# Patient Record
Sex: Male | Born: 1980
Health system: Southern US, Community
[De-identification: ages and names within clinical notes are randomized; demographics above are authoritative.]

---

## 2006-01-21 ENCOUNTER — Emergency Department (HOSPITAL_COMMUNITY): Admission: EM | Admit: 2006-01-21 | Discharge: 2006-01-21 | Payer: Self-pay | Admitting: Emergency Medicine

## 2008-01-16 IMAGING — CR DG ANKLE COMPLETE 3+V*L*
3 series · 3 of 3 positions shown · non-contrast
Comparison: none

CLINICAL DATA: Dog bite.
 RIGHT THIRD FINGER:

[view not recorded (1 of 3)]
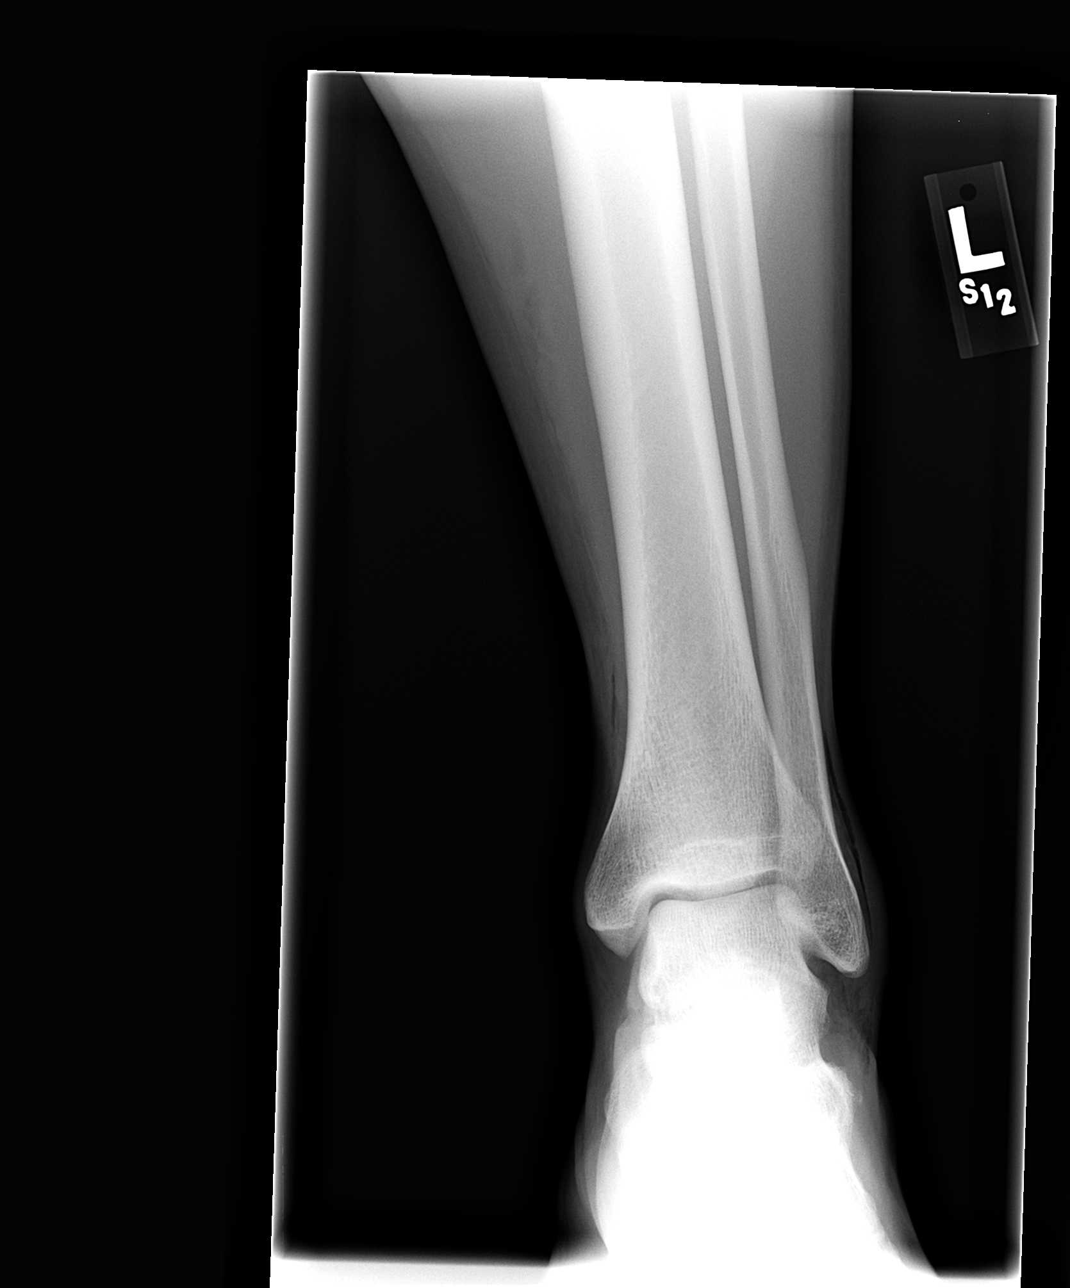

[view not recorded (2 of 3)]
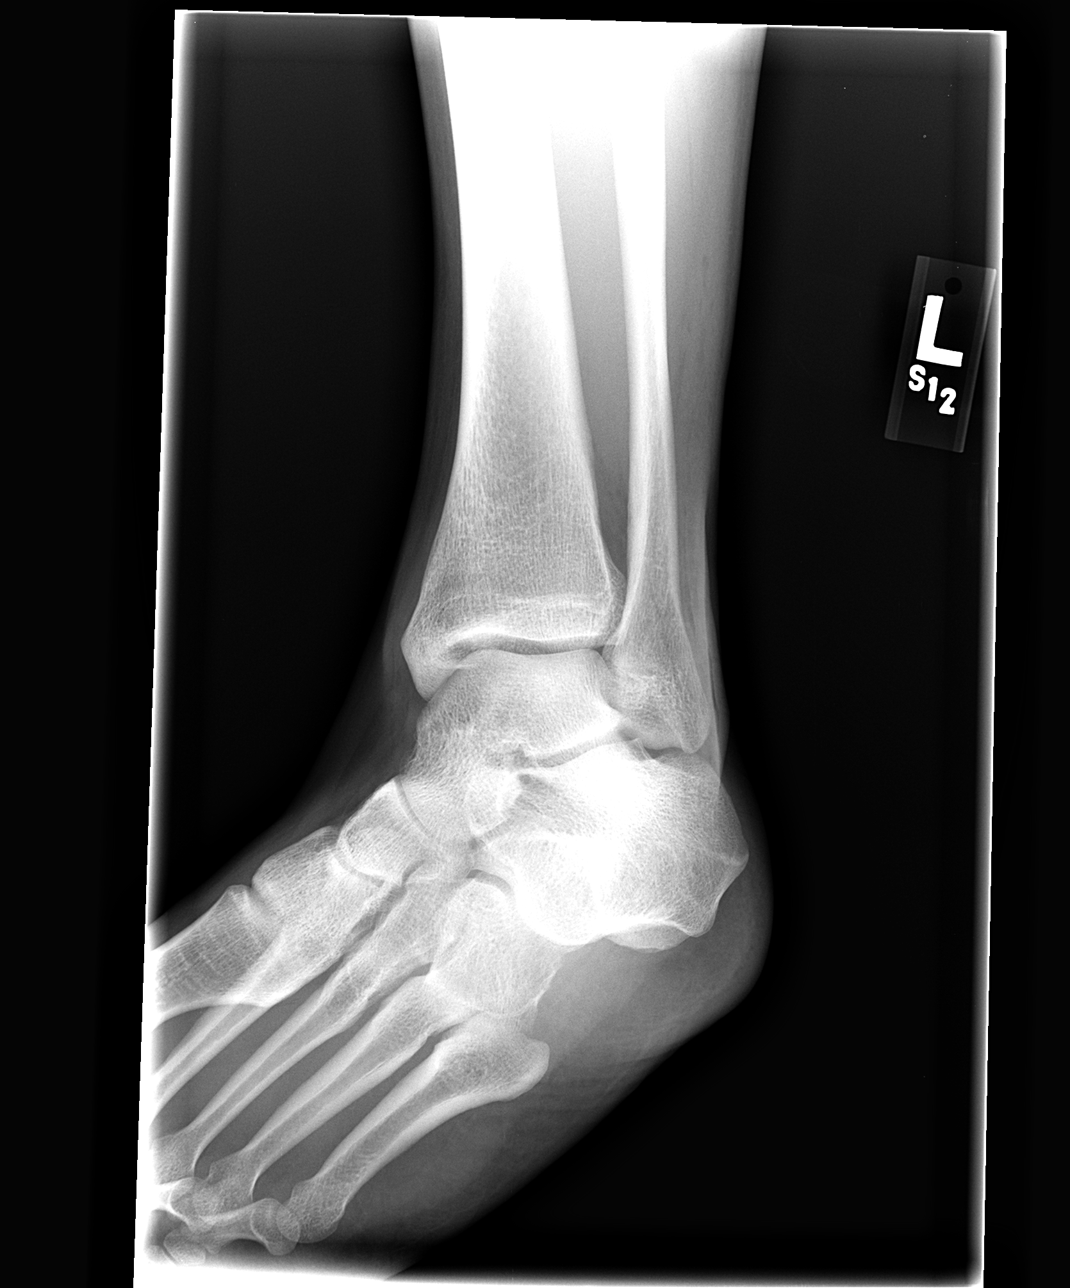

[view not recorded (3 of 3)]
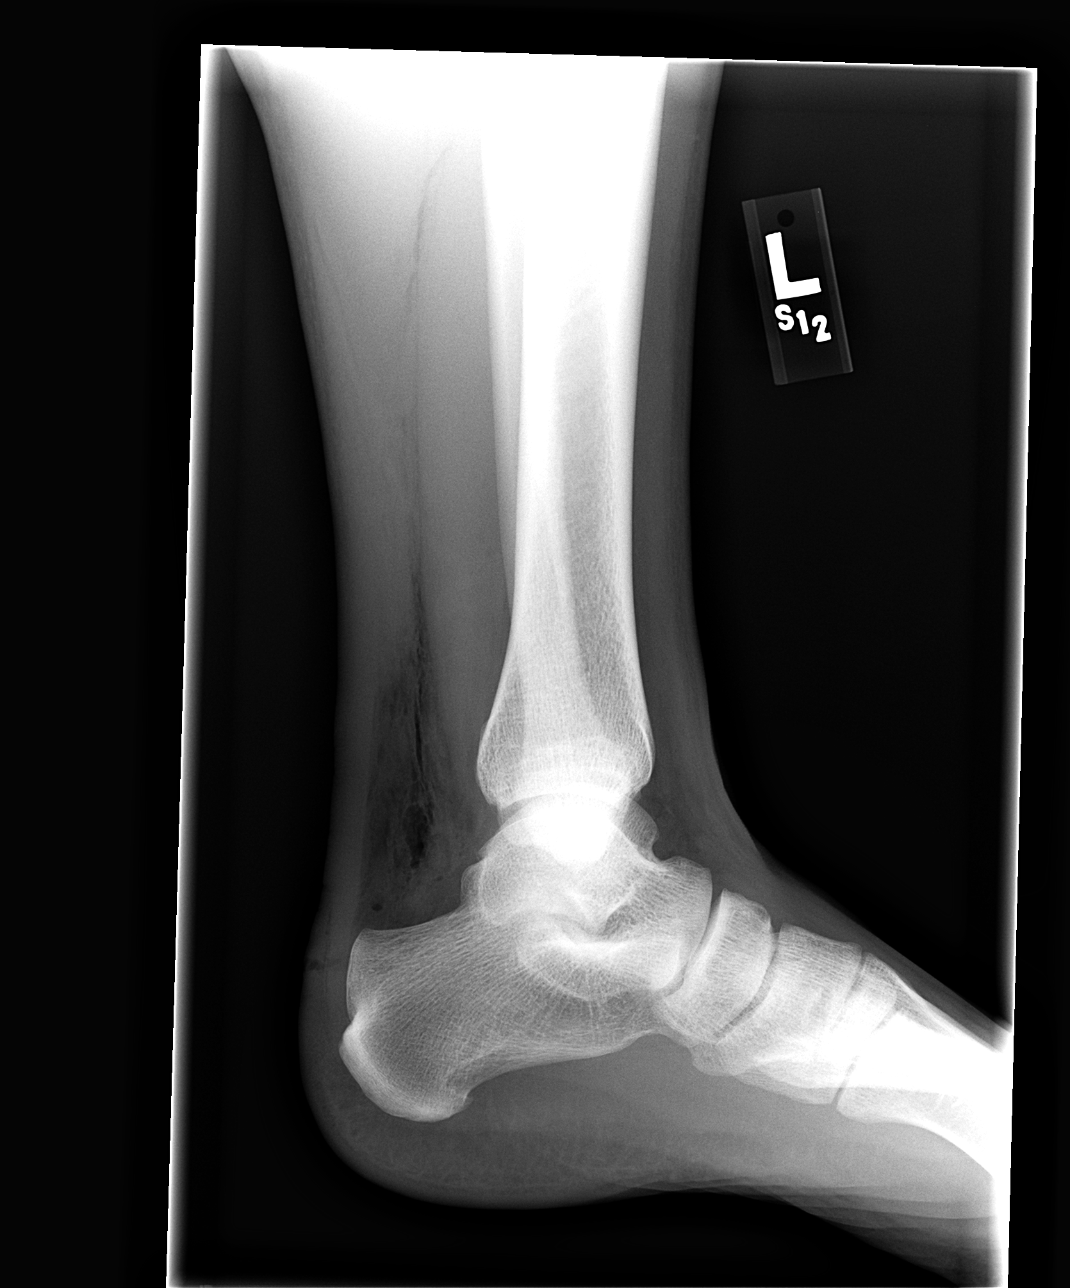

[3 of 3 positions shown; findings below may reference images not displayed]

FINDINGS: There is a fracture of the tuft of the distal phalanx with a 1 mm bone fragment in the soft tissues.  There is also laceration present.
IMPRESSION: Small avulsion fracture of the tuft of the distal third phalanx.
 LEFT ANKLE ? 3 VIEW:
FINDINGS: Normal alignment.  No fracture.  No radiopaque foreign body seen.  There is laceration with gas in the deep soft tissues posterior to the ankle.
IMPRESSION: Negative for fracture.

## 2019-12-30 ENCOUNTER — Ambulatory Visit
Admission: RE | Admit: 2019-12-30 | Discharge: 2019-12-30 | Disposition: A | Discharge status: Home / Self Care | Payer: Commercial Managed Care - PPO | Source: Ambulatory Visit | Attending: Family Medicine | Admitting: Family Medicine

## 2019-12-30 ENCOUNTER — Other Ambulatory Visit: Payer: Self-pay | Admitting: Family Medicine

## 2019-12-30 DIAGNOSIS — R0789 Other chest pain: Secondary | ICD-10-CM

## 2020-02-27 ENCOUNTER — Other Ambulatory Visit: Payer: Self-pay | Admitting: Family Medicine

## 2020-02-27 DIAGNOSIS — R7989 Other specified abnormal findings of blood chemistry: Secondary | ICD-10-CM

## 2020-03-09 ENCOUNTER — Other Ambulatory Visit: Payer: Commercial Managed Care - PPO

## 2021-01-08 ENCOUNTER — Other Ambulatory Visit: Payer: Self-pay | Admitting: Family Medicine

## 2021-01-08 DIAGNOSIS — R7989 Other specified abnormal findings of blood chemistry: Secondary | ICD-10-CM

## 2021-02-15 ENCOUNTER — Other Ambulatory Visit: Payer: Self-pay

## 2021-02-15 ENCOUNTER — Ambulatory Visit: Payer: Commercial Managed Care - PPO | Admitting: Podiatry

## 2021-02-15 DIAGNOSIS — B353 Tinea pedis: Secondary | ICD-10-CM | POA: Diagnosis not present

## 2021-02-15 DIAGNOSIS — Q666 Other congenital valgus deformities of feet: Secondary | ICD-10-CM

## 2021-02-15 MED ORDER — CLOTRIMAZOLE-BETAMETHASONE 1-0.05 % EX CREA: 1.0000 "application " | TOPICAL_CREAM | Freq: Two times a day (BID) | CUTANEOUS | 0 refills | Status: AC

## 2021-02-21 NOTE — Progress Notes (Signed)
  Subjective:  Patient ID: William Guzman, male    DOB: 09-01-1980,  MRN: [id number]  Chief Complaint  Patient presents with   Nail Problem    Nail fungus     40 y.o. male presents with the above complaint.  Patient presents with complaint of bilateral plantar athlete's foot/itching.  Patient states been going for quite some time has progressed to gotten worse.  Is been doing this for quite some time.  There are some dry skin associated with it.  He is not very good with lotion.  He is tried some over-the-counter medication none of which has helped.  There is no pain associated with the pain scale 0 out of 10 is more itching and annoying.  He has tried various different things none of which has helped.  He would like to discuss treatment options.   Review of Systems: Negative except as noted in the HPI. Denies N/V/F/Ch.  No past medical history on file.  Current Outpatient Medications:    clotrimazole-betamethasone (LOTRISONE) cream, Apply 1 application topically 2 (two) times daily., Disp: 30 g, Rfl: 0   amLODipine (NORVASC) 5 MG tablet, Take 5 mg by mouth daily., Disp: , Rfl:    lisinopril (ZESTRIL) 10 MG tablet, Take 10 mg by mouth daily., Disp: , Rfl:   Social History   Tobacco Use  Smoking Status Not on file  Smokeless Tobacco Not on file    Not on File Objective:  There were no vitals filed for this visit. There is no height or weight on file to calculate BMI. Constitutional Well developed. Well nourished.  Vascular Dorsalis pedis pulses palpable bilaterally. Posterior tibial pulses palpable bilaterally. Capillary refill normal to all digits.  No cyanosis or clubbing noted. Pedal hair growth normal.  Neurologic Normal speech. Oriented to person, place, and time. Epicritic sensation to light touch grossly present bilaterally.  Dermatologic Epidermal lysis with subjective component of itching associated with it to bilateral plantar foot.  Some dry skin/xerosis noted as  well.  Gait examination shows pes planovalgus foot structure with calcaneovalgus to many toe signs partially able to recruit the arch with dorsiflexion of the hallux.  Orthopedic: Normal joint ROM without pain or crepitus bilaterally. No visible deformities. No bony tenderness.   Radiographs: None Assessment:   1. Pes planovalgus   2. Tinea pedis of both feet    Plan:  Patient was evaluated and treated and all questions answered.  Bilateral athlete's foot -I explained to the patient the etiology of athlete's foot emergency room and options were discussed.  Given the amount of athlete's foot that is present with subjective component of itching I believe he will benefit from Lotrisone cream.  I have asked him apply twice a day.  If there is no resolve meant we could discuss oral medication versus ammonium lactate for dry skin as he does have a superimposition of dry skin associated with this.  Pes planovalgus -I explained the patient the etiology of pes planovalgus and various treatment options were discussed.  Given the architecture of his foot and sometimes getting healed and arch pain I believe he will benefit from custom-made orthotic orthotics to help control the hindfoot motion support the arches and the heel of his foot.  Patient states understanding like to proceed with orthotics -He was casted for orthotics  No follow-ups on file.

## 2021-03-14 ENCOUNTER — Telehealth: Payer: Self-pay | Admitting: Podiatry

## 2021-03-14 NOTE — Telephone Encounter (Signed)
Orthotics in.. lvm for pt to call to schedule an appt to pick them up. °

## 2021-03-26 ENCOUNTER — Telehealth: Payer: Self-pay | Admitting: Podiatry

## 2021-03-26 NOTE — Telephone Encounter (Signed)
Pt left message returning my call about getting scheduled to pick up the orthotics as they are in..   I returned call and left message for pt that I have scheduled him for 10.14 @ 115 with Dr Allena Katz and to call to confirm appt works for him.Marland Kitchen

## 2021-03-29 ENCOUNTER — Other Ambulatory Visit: Payer: Self-pay

## 2021-03-29 ENCOUNTER — Ambulatory Visit (INDEPENDENT_AMBULATORY_CARE_PROVIDER_SITE_OTHER): Payer: Commercial Managed Care - PPO | Admitting: Podiatry

## 2021-03-29 DIAGNOSIS — Q666 Other congenital valgus deformities of feet: Secondary | ICD-10-CM

## 2021-04-03 NOTE — Progress Notes (Signed)
Patient is orthotics were picked up.  They are functioning well. 

## 2021-04-09 ENCOUNTER — Other Ambulatory Visit: Payer: Self-pay | Admitting: Family Medicine

## 2021-04-09 DIAGNOSIS — R7989 Other specified abnormal findings of blood chemistry: Secondary | ICD-10-CM

## 2021-12-24 IMAGING — CR DG CHEST 2V
2 series · 2 of 2 positions shown · non-contrast
Comparison: None.

CLINICAL DATA: Lower left-sided chest pain.

EXAM:
CHEST - 2 VIEW

[w chest pa]
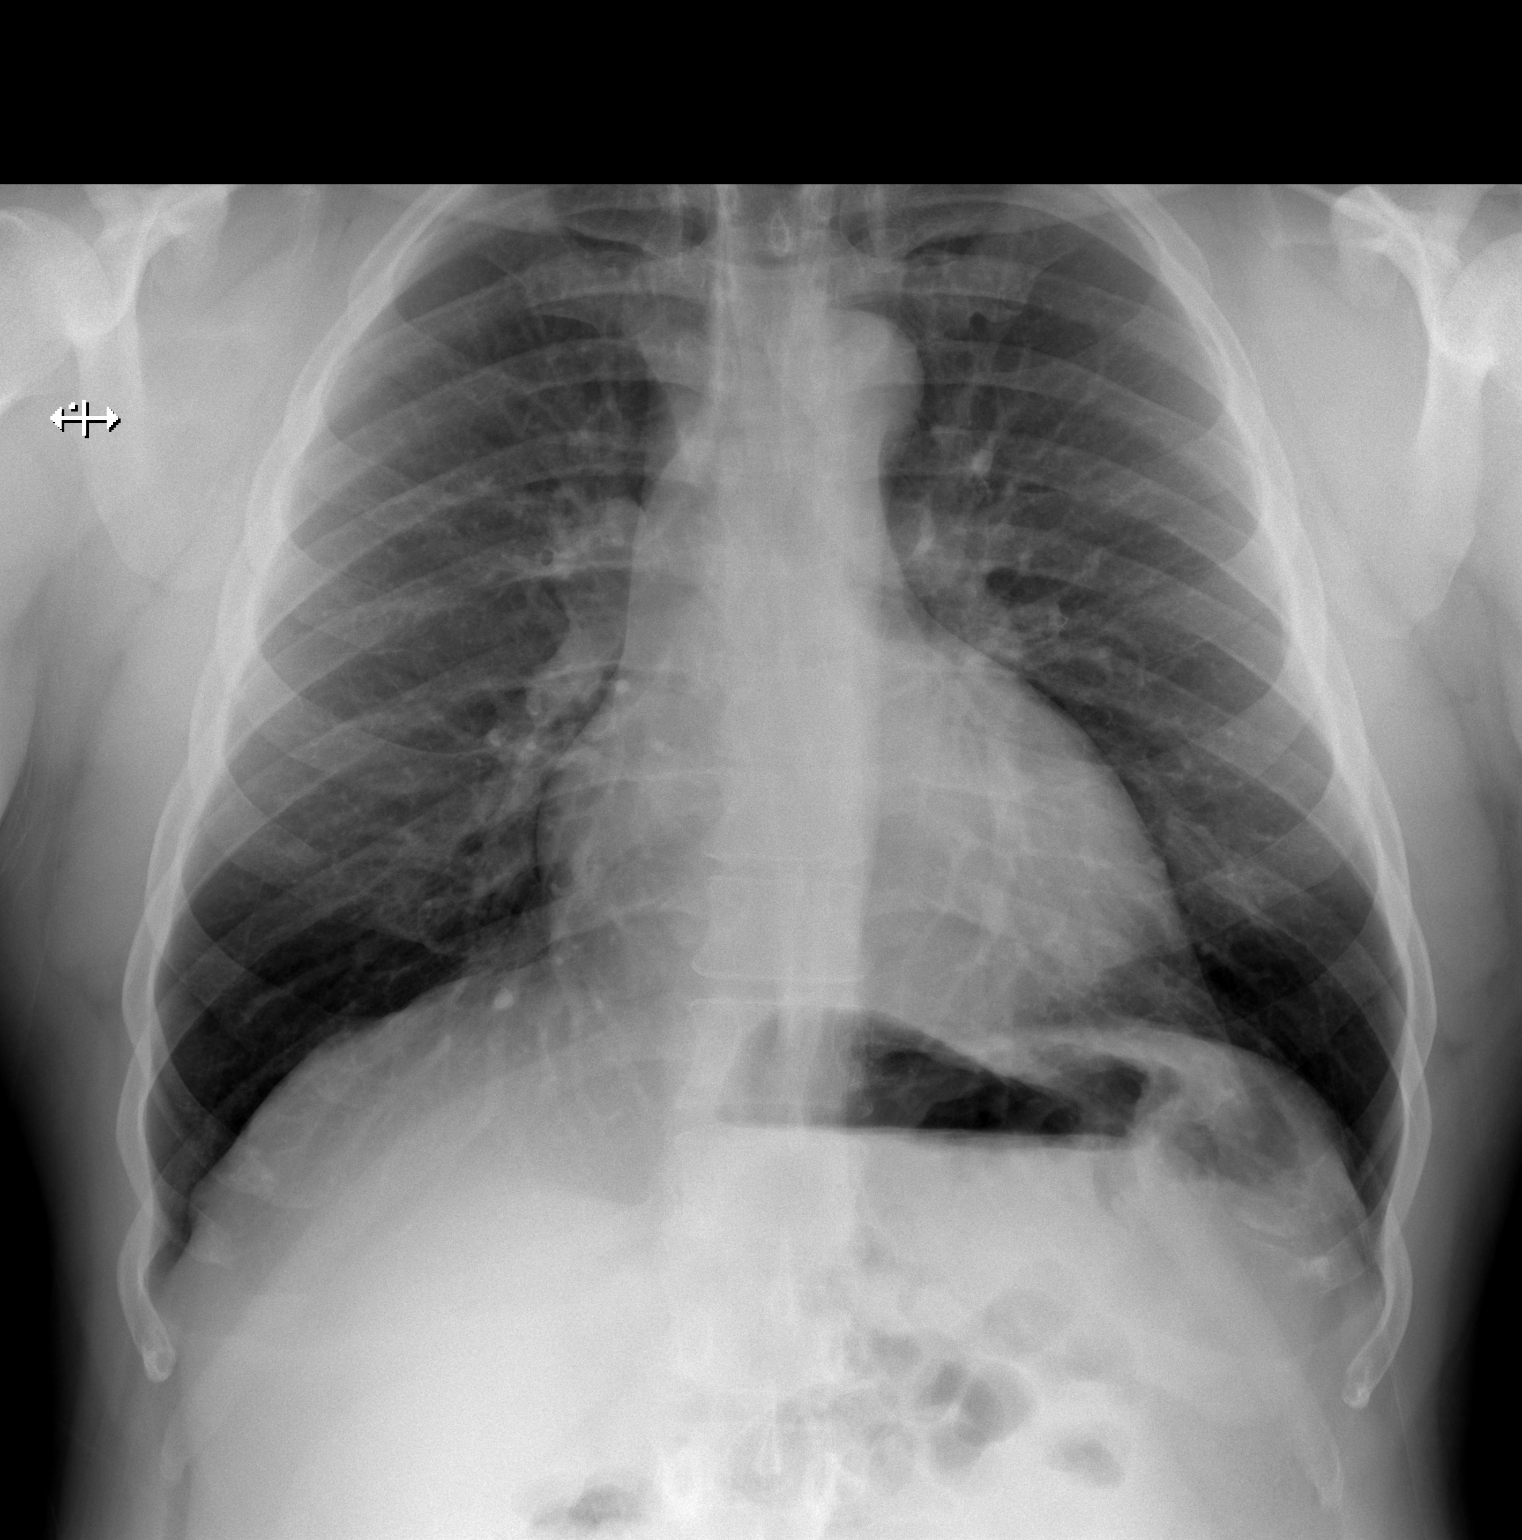

[w chest lat]
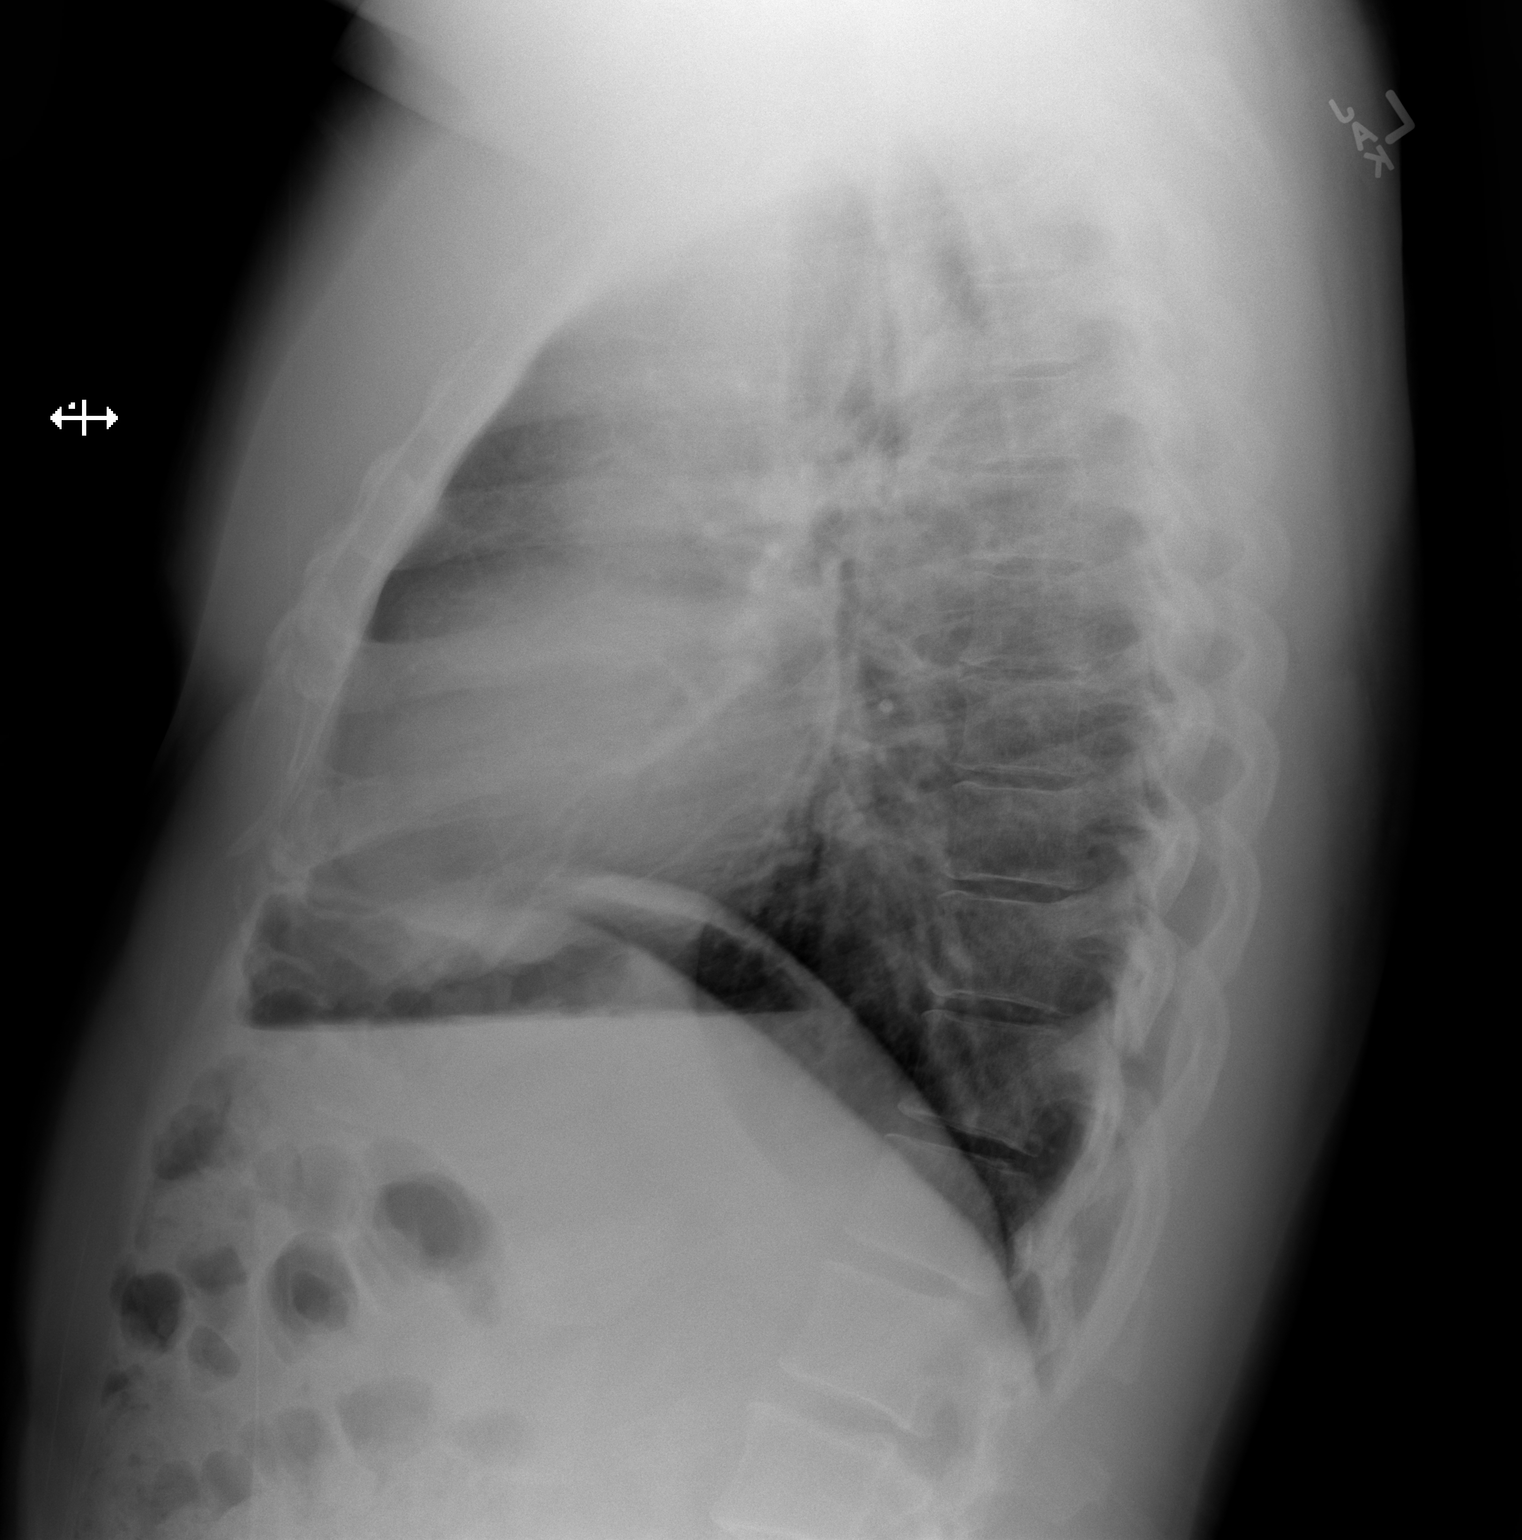

[2 of 2 positions shown; findings below may reference images not displayed]

FINDINGS: There is no evidence of acute infiltrate, pleural effusion or
pneumothorax. The heart size and mediastinal contours are within
normal limits. The visualized skeletal structures are unremarkable.
IMPRESSION: No active cardiopulmonary disease.

## 2022-12-11 ENCOUNTER — Ambulatory Visit: Payer: Commercial Managed Care - PPO | Admitting: Podiatry

## 2022-12-17 ENCOUNTER — Encounter: Payer: Self-pay | Admitting: Podiatry

## 2022-12-17 ENCOUNTER — Ambulatory Visit: Payer: Commercial Managed Care - PPO | Admitting: Podiatry

## 2022-12-17 DIAGNOSIS — B351 Tinea unguium: Secondary | ICD-10-CM | POA: Diagnosis not present

## 2022-12-17 DIAGNOSIS — Q666 Other congenital valgus deformities of feet: Secondary | ICD-10-CM | POA: Diagnosis not present

## 2022-12-17 DIAGNOSIS — Z79899 Other long term (current) drug therapy: Secondary | ICD-10-CM | POA: Diagnosis not present

## 2022-12-17 DIAGNOSIS — B353 Tinea pedis: Secondary | ICD-10-CM | POA: Diagnosis not present

## 2022-12-17 NOTE — Progress Notes (Signed)
Subjective:  Patient ID: William Guzman, male    DOB: 05/31/1981,  MRN: [id number]  Chief Complaint  Patient presents with   Tinea Pedis    Pt stated that his skin is dry and itchy sometimes it burns he has fungus under his nails     42 y.o. male presents with the above complaint.  Patient presents with multiple complaints of bilateral athlete's foot/dry skin as well as bilateral foot onychomycosis.  Patient states been present for quite some time he is tried topical medication which has not helped.  He would like to discuss oral medication.  He would also like to get new orthotics as the previous ones are getting worn  Review of Systems: Negative except as noted in the HPI. Denies N/V/F/Ch.  No past medical history on file.  Current Outpatient Medications:    amLODipine (NORVASC) 5 MG tablet, Take 5 mg by mouth daily., Disp: , Rfl:    clotrimazole-betamethasone (LOTRISONE) cream, Apply 1 application topically 2 (two) times daily., Disp: 30 g, Rfl: 0   lisinopril (ZESTRIL) 10 MG tablet, Take 10 mg by mouth daily., Disp: , Rfl:   Social History   Tobacco Use  Smoking Status Not on file  Smokeless Tobacco Not on file    Not on File Objective:  There were no vitals filed for this visit. There is no height or weight on file to calculate BMI. Constitutional Well developed. Well nourished.  Vascular Dorsalis pedis pulses palpable bilaterally. Posterior tibial pulses palpable bilaterally. Capillary refill normal to all digits.  No cyanosis or clubbing noted. Pedal hair growth normal.  Neurologic Normal speech. Oriented to person, place, and time. Epicritic sensation to light touch grossly present bilaterally.  Dermatologic Epidermal lysis with subjective component of itching associated with it to bilateral plantar foot.  Some dry skin/xerosis noted as well.  Thickened elongated dystrophic mycotic nail noted to bilateral hallux fourth and fifth digit  Gait examination shows pes  planovalgus foot structure with calcaneovalgus to many toe signs partially able to recruit the arch with dorsiflexion of the hallux.  Orthopedic: Normal joint ROM without pain or crepitus bilaterally. No visible deformities. No bony tenderness.   Radiographs: None Assessment:   1. Long-term use of high-risk medication   2. Pes planovalgus   3. Tinea pedis of both feet   4. Nail fungus   5. Onychomycosis due to dermatophyte     Plan:  Patient was evaluated and treated and all questions answered.  Bilateral hallux fourth and fifth digit onychomycosis -Educated the patient on the etiology of onychomycosis and various treatment options associated with improving the fungal load.  I explained to the patient that there is 3 treatment options available to treat the onychomycosis including topical, p.o., laser treatment.  Patient elected to undergo p.o. options with Lamisil/terbinafine therapy.  In order for me to start the medication therapy, I explained to the patient the importance of evaluating the liver and obtaining the liver function test.  Once the liver function test comes back normal I will start him on 75-month course of Lamisil therapy.  Patient understood all risk and would like to proceed with Lamisil therapy.  I have asked the patient to immediately stop the Lamisil therapy if she has any reactions to it and call the office or go to the emergency room right away.  Patient states understanding   Bilateral athlete's foot/severe dryness -Patient will benefit from oral Lamisil medication which I have asked him to continue taking it for  the nail fungus as well as athlete's foot. -Patient will benefit from ammonium lactate ammonium lactate was sent to the pharmacy of asked him apply twice a day -I discussed with the patient that this would not go away on its own without active treatment.  He states understanding.  Pes planovalgus -I explained the patient the etiology of pes planovalgus and  various treatment options were discussed.  Given the architecture of his foot and sometimes getting healed and arch pain I believe he will benefit from custom-made orthotic orthotics to help control the hindfoot motion support the arches and the heel of his foot.  Patient states understanding like to proceed with orthotics -He was casted for orthotics  No follow-ups on file.

## 2023-01-08 ENCOUNTER — Telehealth: Payer: Self-pay | Admitting: Podiatry

## 2023-01-08 NOTE — Telephone Encounter (Signed)
Lmom for patient to call back to set up appointment to pick up orthotics    Balance pending insurance

## 2023-01-28 ENCOUNTER — Other Ambulatory Visit: Payer: Commercial Managed Care - PPO

## 2023-03-19 ENCOUNTER — Telehealth: Payer: Self-pay | Admitting: Podiatry

## 2023-03-19 NOTE — Telephone Encounter (Signed)
Called lmom for the pt to get him scheduled to pick up his orthotics.

## 2023-04-24 ENCOUNTER — Other Ambulatory Visit: Payer: Self-pay | Admitting: Podiatry

## 2023-04-24 ENCOUNTER — Ambulatory Visit: Payer: Commercial Managed Care - PPO | Admitting: Podiatry

## 2023-04-24 DIAGNOSIS — Q666 Other congenital valgus deformities of feet: Secondary | ICD-10-CM | POA: Diagnosis not present

## 2023-04-24 DIAGNOSIS — Z79899 Other long term (current) drug therapy: Secondary | ICD-10-CM | POA: Diagnosis not present

## 2023-04-24 DIAGNOSIS — B351 Tinea unguium: Secondary | ICD-10-CM

## 2023-04-24 NOTE — Progress Notes (Signed)
Subjective:  Patient ID: William Guzman, male    DOB: May 20, 1981,  MRN: [id number]  Chief Complaint  Patient presents with   Nail Problem    Fungus on toe nails discuss other medication options    42 y.o. male presents with the above complaint.  Patient presents with multiple complaints of bilateral athlete's foot/dry skin as well as bilateral foot onychomycosis.  Patient states been present for quite some time he is tried topical medication which has not helped.  He would like to discuss oral medication.  He would also like to get new orthotics as the previous ones are getting worn  Review of Systems: Negative except as noted in the HPI. Denies N/V/F/Ch.  No past medical history on file.  Current Outpatient Medications:    amLODipine (NORVASC) 5 MG tablet, Take 5 mg by mouth daily., Disp: , Rfl:    lisinopril (ZESTRIL) 10 MG tablet, Take 10 mg by mouth daily., Disp: , Rfl:    clotrimazole-betamethasone (LOTRISONE) cream, Apply 1 application topically 2 (two) times daily. (Patient not taking: Reported on 04/24/2023), Disp: 30 g, Rfl: 0  Social History   Tobacco Use  Smoking Status Not on file  Smokeless Tobacco Not on file    Not on File Objective:  There were no vitals filed for this visit. There is no height or weight on file to calculate BMI. Constitutional Well developed. Well nourished.  Vascular Dorsalis pedis pulses palpable bilaterally. Posterior tibial pulses palpable bilaterally. Capillary refill normal to all digits.  No cyanosis or clubbing noted. Pedal hair growth normal.  Neurologic Normal speech. Oriented to person, place, and time. Epicritic sensation to light touch grossly present bilaterally.  Dermatologic Epidermal lysis with subjective component of itching associated with it to bilateral plantar foot.  Some dry skin/xerosis noted as well.  Thickened elongated dystrophic mycotic nail noted to bilateral hallux fourth and fifth digit  Gait examination shows  pes planovalgus foot structure with calcaneovalgus to many toe signs partially able to recruit the arch with dorsiflexion of the hallux.  Orthopedic: Normal joint ROM without pain or crepitus bilaterally. No visible deformities. No bony tenderness.   Radiographs: None Assessment:   1. Long term use of drug   2. Pes planovalgus   3. Nail fungus   4. Onychomycosis due to dermatophyte     Plan:  Patient was evaluated and treated and all questions answered.  Bilateral hallux fourth and fifth digit onychomycosis -Educated the patient on the etiology of onychomycosis and various treatment options associated with improving the fungal load.  I explained to the patient that there is 3 treatment options available to treat the onychomycosis including topical, p.o., laser treatment.  Patient elected to undergo p.o. options with Lamisil/terbinafine therapy.  In order for me to start the medication therapy, I explained to the patient the importance of evaluating the liver and obtaining the liver function test.  Once the liver function test comes back normal I will start him on 43-month course of Lamisil therapy.  Patient understood all risk and would like to proceed with Lamisil therapy.  I have asked the patient to immediately stop the Lamisil therapy if she has any reactions to it and call the office or go to the emergency room right away.  Patient states understanding   Bilateral athlete's foot/severe dryness -Will continue to take Lamisil and ammonium lactate  Pes planovalgus -I explained the patient the etiology of pes planovalgus and various treatment options were discussed.  Given the architecture  of his foot and sometimes getting healed and arch pain I believe he will benefit from custom-made orthotic orthotics to help control the hindfoot motion support the arches and the heel of his foot.  Patient states understanding like to proceed with orthotics -Orthotics were dispensed  No follow-ups on  file.

## 2023-04-25 LAB — HEPATIC FUNCTION PANEL
ALT: 35 [IU]/L (ref 0–44)
AST: 20 [IU]/L (ref 0–40)
Albumin: 4.2 g/dL (ref 4.1–5.1)
Alkaline Phosphatase: 71 [IU]/L (ref 44–121)
Bilirubin Total: 0.5 mg/dL (ref 0.0–1.2)
Bilirubin, Direct: 0.16 mg/dL (ref 0.00–0.40)
Total Protein: 6.7 g/dL (ref 6.0–8.5)

## 2023-05-04 MED ORDER — TERBINAFINE HCL 250 MG PO TABS: 250.0000 mg | ORAL_TABLET | Freq: Every day | ORAL | 0 refills | Status: AC

## 2023-05-04 NOTE — Addendum Note (Signed)
Addended by: Nicholes Rough on: 05/04/2023 08:33 AM   Modules accepted: Orders

## 2023-11-25 ENCOUNTER — Other Ambulatory Visit: Payer: Self-pay | Admitting: Podiatry
